# Patient Record
Sex: Male | Born: 1950 | Race: White | Hispanic: No | Marital: Married | Smoking: Never smoker
Health system: Southern US, Community
[De-identification: ages and names within clinical notes are randomized; demographics above are authoritative.]

## PROBLEM LIST (undated history)

## (undated) DIAGNOSIS — E119 Type 2 diabetes mellitus without complications: Secondary | ICD-10-CM

## (undated) HISTORY — PX: MOUTH SURGERY: SHX715

---

## 2009-03-19 ENCOUNTER — Emergency Department (HOSPITAL_COMMUNITY): Admission: EM | Admit: 2009-03-19 | Discharge: 2009-03-19 | Payer: Self-pay | Admitting: Emergency Medicine

## 2010-10-04 IMAGING — CT CT PELVIS W/ CM
1 of 3 series · 14 of 32 positions shown, 19 images · IV contrast (APPLIED)
Comparison: None

CT ABDOMEN

CLINICAL DATA: Vomiting and abdominal pain

CT ABDOMEN AND PELVIS WITH CONTRAST
TECHNIQUE: Multidetector CT imaging of the abdomen and pelvis was
performed using the standard protocol following bolus
administration of intravenous contrast.
Contrast: 100 ml of omni 300

[Series 5: abd/pelv with 5.0 b31f st · axial · 0.86mm/px · z∈[-191,+264]mm · 14 of 101 slices shown, 19 images]
[im 5/101  soft-tissue]
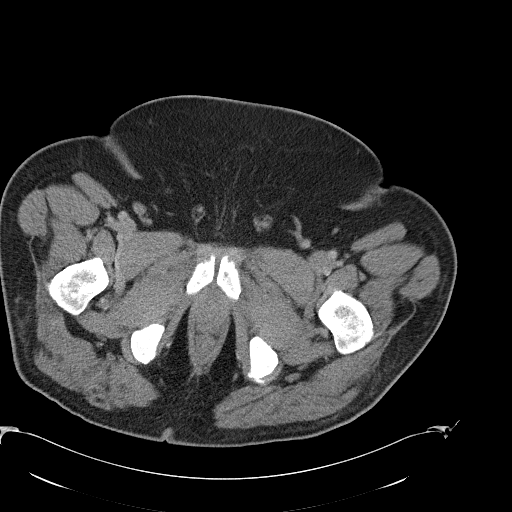
[im 5/101  bone]
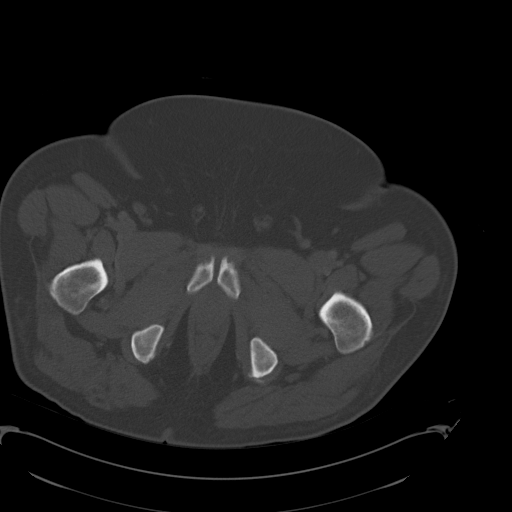
[im 15/101  soft-tissue]
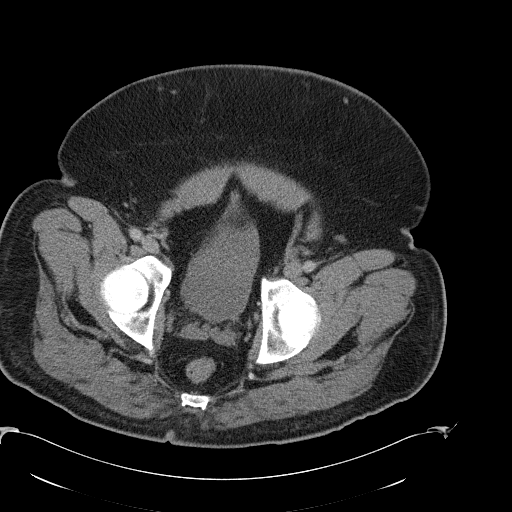
[im 20/101  soft-tissue]
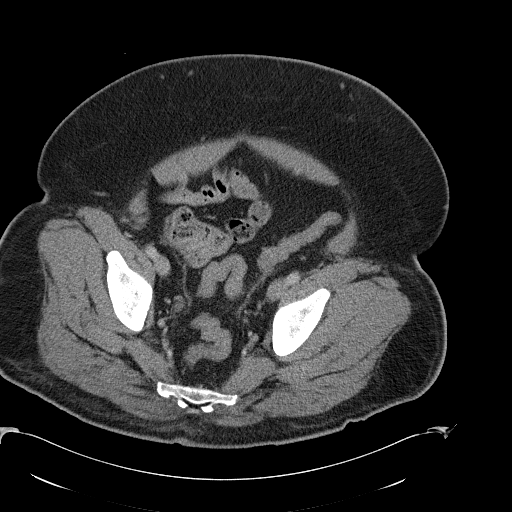
[im 29/101  soft-tissue]
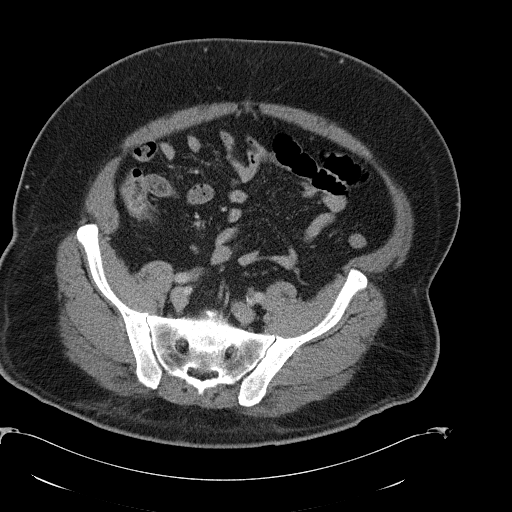
[im 34/101  soft-tissue]
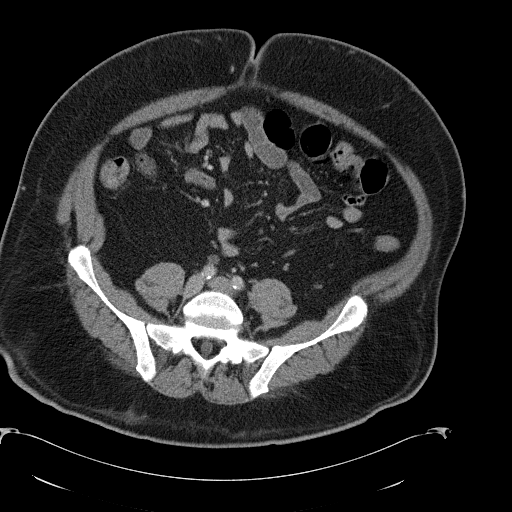
[im 43/101  soft-tissue]
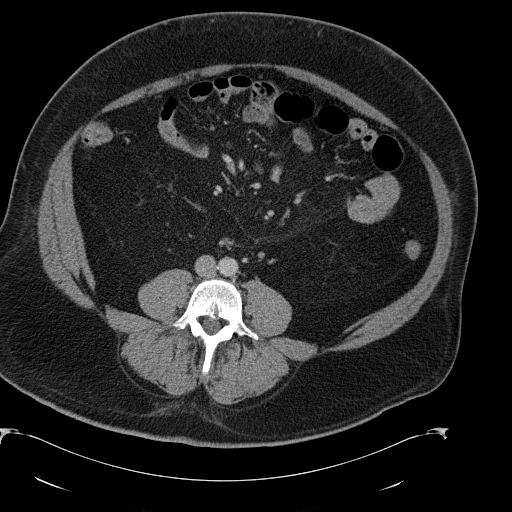
[im 53/101  soft-tissue]
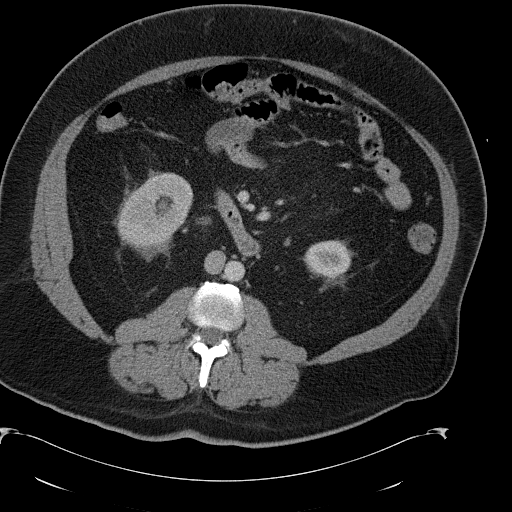
[im 58/101  soft-tissue]
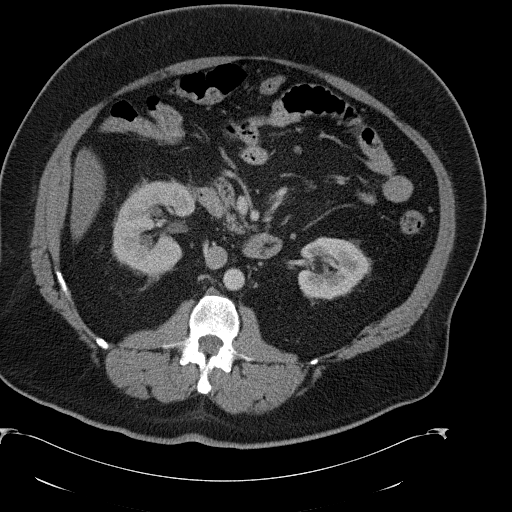
[im 67/101  soft-tissue]
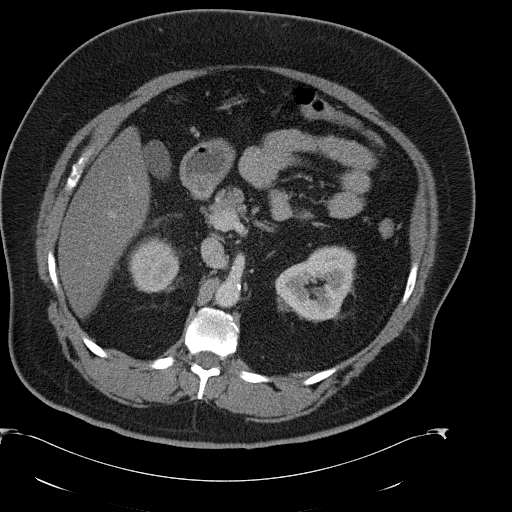
[im 67/101  bone]
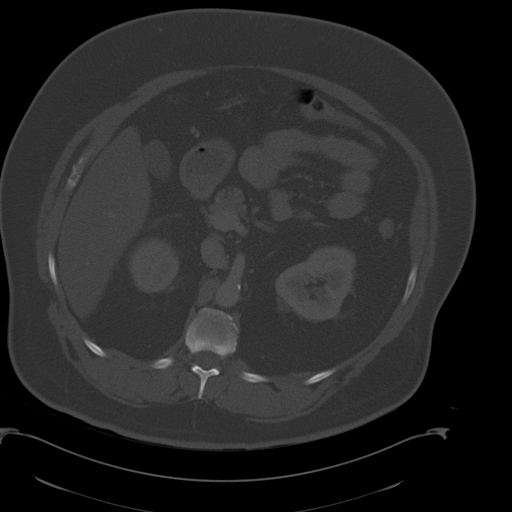
[im 72/101  soft-tissue]
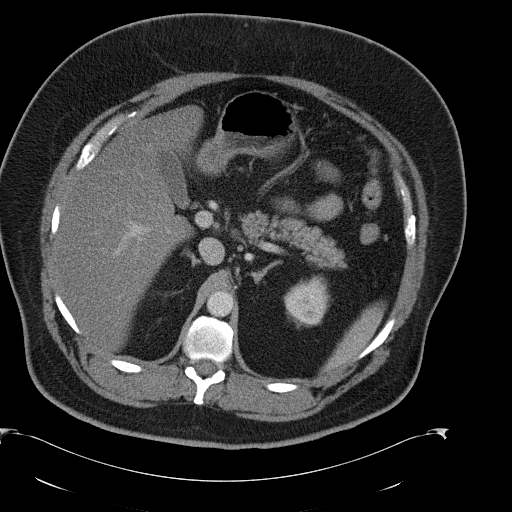
[im 81/101  soft-tissue]
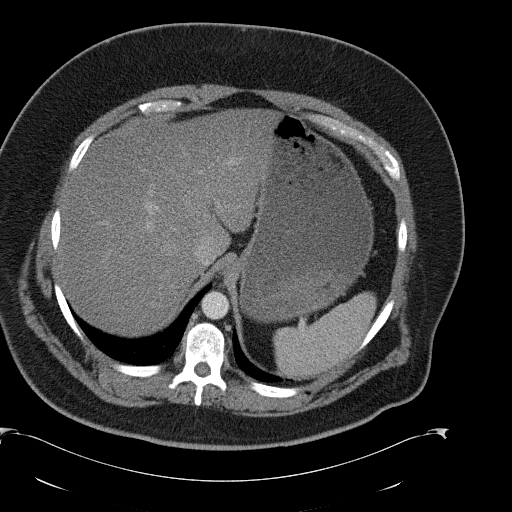
[im 81/101  lung]
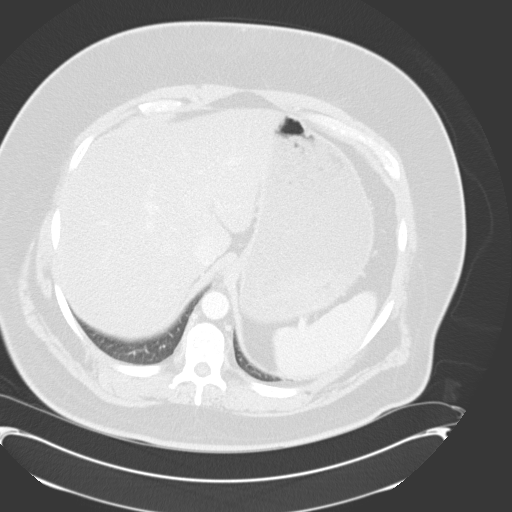
[im 86/101  soft-tissue]
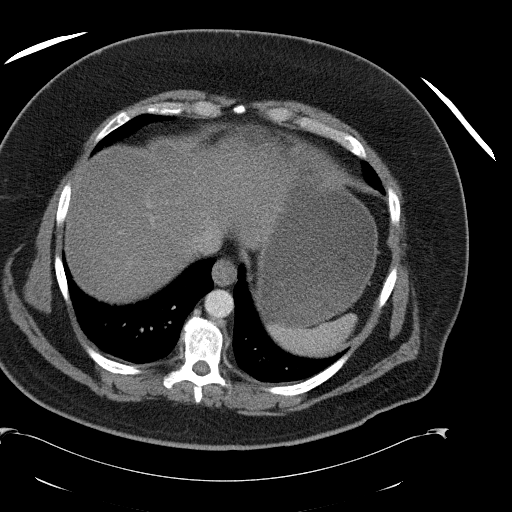
[im 86/101  lung]
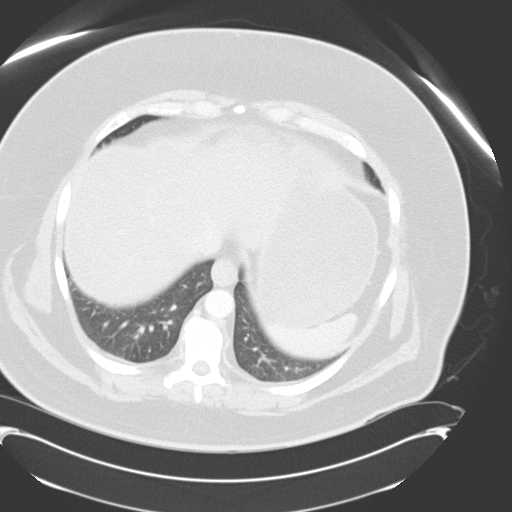
[im 91/101  lung]
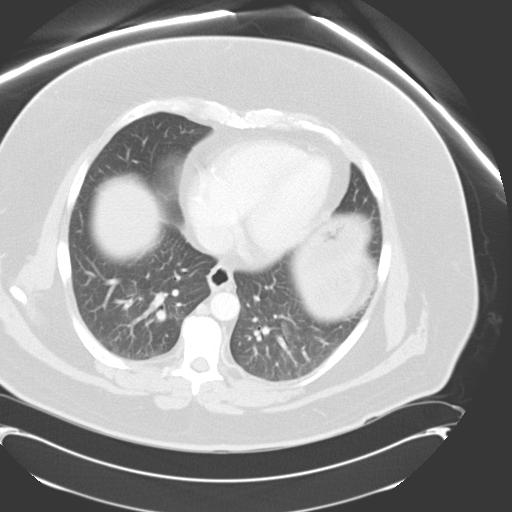
[im 96/101  soft-tissue]
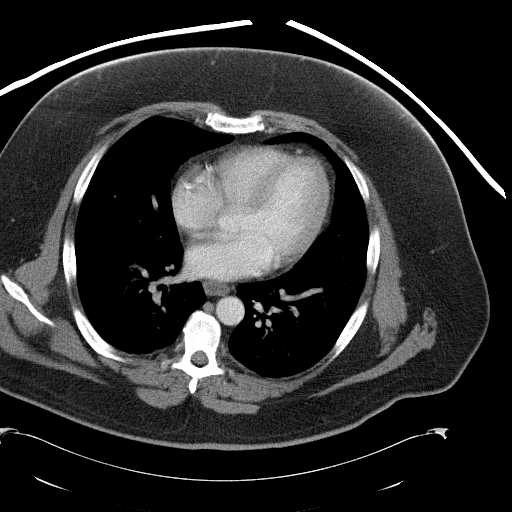
[im 96/101  lung]
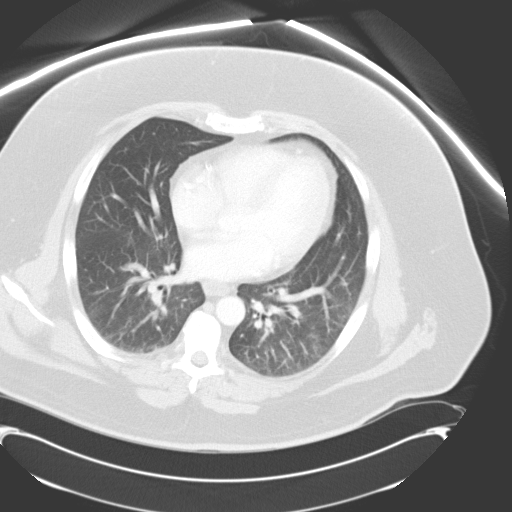

[14 of 32 positions shown; findings below may reference images not displayed]

FINDINGS: Visualized lung bases are clear.

There is fatty infiltration of the liver.

No focal liver abnormalities are noted.

Spleen is normal.

The pancreas is normal.

The adrenal glands are normal.

The left kidney is normal.

There is asymmetric right-sided nephromegaly and perinephric fat
stranding.  There is a small stone within the upper pole of the
right kidney.  Mild right-sided hydronephrosis and hydroureter is
noted.  Within the distal right ureter there is a stone which
measures 3.1 mm.

Gallbladder is unremarkable by CT.

No biliary ductal dilation.

Stomach and visualized large and small bowel are unremarkable.

Abdominal aorta normal is in caliber.

No significant lymphadenopathy.

No free fluid or abnormal fluid collections.
IMPRESSION: 1.  Distal right ureteral calculus measures approximately 3 mm.
There is resultant right-sided hydronephrosis.
2.  Fatty infiltration of the liver parenchyma.

CT PELVIS
FINDINGS: Visualized colon and small bowel are unremarkable.

No free fluid or abnormal fluid collections.

No significant lymphadenopathy.

Urinary bladder is normal.

Within the distal right ureter there is a small stone measuring
mm, image 84.
IMPRESSION: 1.  Distal right ureteral calculus measures 3.1 mm

## 2010-12-23 LAB — URINALYSIS, ROUTINE W REFLEX MICROSCOPIC
Bilirubin Urine: NEGATIVE
Glucose, UA: 500 mg/dL — AB
Ketones, ur: 15 mg/dL — AB
Leukocytes, UA: NEGATIVE
Nitrite: NEGATIVE
Protein, ur: NEGATIVE mg/dL
Specific Gravity, Urine: >1.046 — ABNORMAL HIGH (ref 1.005–1.030)
Urobilinogen, UA: 0.2 mg/dL (ref 0.0–1.0)
pH: 5.5 (ref 5.0–8.0)

## 2010-12-23 LAB — DIFFERENTIAL
Basophils Absolute: 0.1 10*3/uL (ref 0.0–0.1)
Basophils Relative: 1 % (ref 0–1)
Eosinophils Absolute: 0.2 10*3/uL (ref 0.0–0.7)
Eosinophils Relative: 2 % (ref 0–5)
Lymphocytes Relative: 15 % (ref 12–46)
Lymphs Abs: 1.7 10*3/uL (ref 0.7–4.0)
Monocytes Absolute: 0.8 10*3/uL (ref 0.1–1.0)
Monocytes Relative: 7 % (ref 3–12)
Neutro Abs: 8.4 10*3/uL — ABNORMAL HIGH (ref 1.7–7.7)
Neutrophils Relative %: 75 % (ref 43–77)

## 2010-12-23 LAB — COMPREHENSIVE METABOLIC PANEL WITH GFR
ALT: 28 U/L (ref 0–53)
AST: 23 U/L (ref 0–37)
Albumin: 3.7 g/dL (ref 3.5–5.2)
Alkaline Phosphatase: 56 U/L (ref 39–117)
BUN: 18 mg/dL (ref 6–23)
CO2: 24 meq/L (ref 19–32)
Calcium: 9.1 mg/dL (ref 8.4–10.5)
Chloride: 103 meq/L (ref 96–112)
Creatinine, Ser: 1.2 mg/dL (ref 0.4–1.5)
GFR calc non Af Amer: >60 mL/min
Glucose, Bld: 232 mg/dL — ABNORMAL HIGH (ref 70–99)
Potassium: 3.7 meq/L (ref 3.5–5.1)
Sodium: 135 meq/L (ref 135–145)
Total Bilirubin: 0.9 mg/dL (ref 0.3–1.2)
Total Protein: 6.3 g/dL (ref 6.0–8.3)

## 2010-12-23 LAB — URINE MICROSCOPIC-ADD ON

## 2010-12-23 LAB — CBC
HCT: 40.9 % (ref 39.0–52.0)
Hemoglobin: 13.9 g/dL (ref 13.0–17.0)
RBC: 4.65 MIL/uL (ref 4.22–5.81)
RDW: 15.8 % — ABNORMAL HIGH (ref 11.5–15.5)

## 2010-12-23 LAB — LIPASE, BLOOD: Lipase: 24 U/L (ref 11–59)

## 2018-07-09 ENCOUNTER — Encounter (HOSPITAL_COMMUNITY): Payer: Self-pay

## 2018-07-09 ENCOUNTER — Emergency Department (HOSPITAL_COMMUNITY)
Admission: EM | Admit: 2018-07-09 | Discharge: 2018-07-09 | Disposition: A | Discharge status: Home / Self Care | Payer: Medicare Other | Attending: Emergency Medicine | Admitting: Emergency Medicine

## 2018-07-09 ENCOUNTER — Other Ambulatory Visit: Payer: Self-pay

## 2018-07-09 DIAGNOSIS — Z794 Long term (current) use of insulin: Secondary | ICD-10-CM | POA: Insufficient documentation

## 2018-07-09 DIAGNOSIS — Z7982 Long term (current) use of aspirin: Secondary | ICD-10-CM | POA: Diagnosis not present

## 2018-07-09 DIAGNOSIS — Z79899 Other long term (current) drug therapy: Secondary | ICD-10-CM | POA: Insufficient documentation

## 2018-07-09 DIAGNOSIS — E119 Type 2 diabetes mellitus without complications: Secondary | ICD-10-CM | POA: Insufficient documentation

## 2018-07-09 DIAGNOSIS — R42 Dizziness and giddiness: Secondary | ICD-10-CM

## 2018-07-09 HISTORY — DX: Type 2 diabetes mellitus without complications: E11.9

## 2018-07-09 LAB — CBC WITH DIFFERENTIAL/PLATELET
ABS IMMATURE GRANULOCYTES: 0.07 10*3/uL (ref 0.00–0.07)
BASOS PCT: 1 %
Basophils Absolute: 0.1 10*3/uL (ref 0.0–0.1)
EOS ABS: 0.1 10*3/uL (ref 0.0–0.5)
Eosinophils Relative: 1 %
HCT: 42.6 % (ref 39.0–52.0)
Hemoglobin: 13.4 g/dL (ref 13.0–17.0)
Immature Granulocytes: 1 %
Lymphocytes Relative: 11 %
Lymphs Abs: 1.4 10*3/uL (ref 0.7–4.0)
MCH: 28.9 pg (ref 26.0–34.0)
MCHC: 31.5 g/dL (ref 30.0–36.0)
MCV: 91.8 fL (ref 80.0–100.0)
MONO ABS: 0.8 10*3/uL (ref 0.1–1.0)
Monocytes Relative: 6 %
NEUTROS ABS: 10.3 10*3/uL — AB (ref 1.7–7.7)
Neutrophils Relative %: 80 %
PLATELETS: 252 10*3/uL (ref 150–400)
RBC: 4.64 MIL/uL (ref 4.22–5.81)
RDW: 13.9 % (ref 11.5–15.5)
WBC: 12.7 10*3/uL — AB (ref 4.0–10.5)
nRBC: 0 % (ref 0.0–0.2)

## 2018-07-09 LAB — COMPREHENSIVE METABOLIC PANEL
ALT: 46 U/L — ABNORMAL HIGH (ref 0–44)
AST: 43 U/L — ABNORMAL HIGH (ref 15–41)
Albumin: 3.6 g/dL (ref 3.5–5.0)
Alkaline Phosphatase: 64 U/L (ref 38–126)
Anion gap: 11 (ref 5–15)
BILIRUBIN TOTAL: 1 mg/dL (ref 0.3–1.2)
BUN: 19 mg/dL (ref 8–23)
CO2: 24 mmol/L (ref 22–32)
Calcium: 9.4 mg/dL (ref 8.9–10.3)
Chloride: 104 mmol/L (ref 98–111)
Creatinine, Ser: 1.11 mg/dL (ref 0.61–1.24)
GFR calc Af Amer: >60 mL/min (ref >60)
Glucose, Bld: 79 mg/dL (ref 70–99)
POTASSIUM: 4 mmol/L (ref 3.5–5.1)
Sodium: 139 mmol/L (ref 135–145)
TOTAL PROTEIN: 6.2 g/dL — AB (ref 6.5–8.1)

## 2018-07-09 LAB — URINALYSIS, ROUTINE W REFLEX MICROSCOPIC
Bacteria, UA: NONE SEEN
Bilirubin Urine: NEGATIVE
GLUCOSE, UA: 50 mg/dL — AB
Hgb urine dipstick: NEGATIVE
Ketones, ur: NEGATIVE mg/dL
Leukocytes, UA: NEGATIVE
Nitrite: NEGATIVE
PH: 6 (ref 5.0–8.0)
Protein, ur: 100 mg/dL — AB
SPECIFIC GRAVITY, URINE: 1.021 (ref 1.005–1.030)

## 2018-07-09 MED ORDER — ONDANSETRON HCL 4 MG/2ML IJ SOLN
4.0000 mg | Freq: Once | INTRAMUSCULAR | Status: AC
  Administered 2018-07-09: 4 mg via INTRAVENOUS
  Filled 2018-07-09: qty 2

## 2018-07-09 MED ORDER — MECLIZINE HCL 25 MG PO TABS
25.0000 mg | ORAL_TABLET | Freq: Once | ORAL | Status: AC
  Administered 2018-07-09: 25 mg via ORAL
  Filled 2018-07-09: qty 1

## 2018-07-09 MED ORDER — MECLIZINE HCL 25 MG PO TABS: 25.0000 mg | ORAL_TABLET | Freq: Three times a day (TID) | ORAL | 0 refills | Status: DC | PRN

## 2018-07-09 MED ORDER — MECLIZINE HCL 25 MG PO TABS: 25.0000 mg | ORAL_TABLET | Freq: Three times a day (TID) | ORAL | 0 refills | Status: AC | PRN

## 2018-07-09 NOTE — ED Triage Notes (Signed)
Per [facility] pt had a sudden onset of dizziness around 1500. EMS reports nystagmus when looking to each side and one episode of vomiting. EMS gave 4mg  of zofran IV.

## 2018-07-09 NOTE — ED Notes (Signed)
Patient verbalizes understanding of discharge instructions. Opportunity for questioning and answers were provided. Armband removed by staff, pt discharged from ED ambulatory w/ family  

## 2018-07-10 NOTE — ED Provider Notes (Signed)
[facility] EMERGENCY DEPARTMENT Provider Note   CSN: [id number] Arrival date & time: 07/09/18  1638     History   Chief Complaint Chief Complaint  Patient presents with  . Dizziness    HPI Rodney Blake is a 67 y.o. male.  67 year old male with past medical history including IDDM, hypertension who presents with dizziness.  Earlier today around 3 PM, he had a sudden onset of dizziness which he states initially starts as lightheadedness but then becomes room spinning sensation.  Today he had an episode of vomiting.  He received 4 mg IV Zofran by EMS.  He reports that he has had 2 other episodes of dizziness this week, usually provoked by moving his head quickly or sitting up.  He has had nausea with these previous episodes.  He denies any associated chest pain, shortness of breath, palpitations, extremity weakness/numbness, vision changes, headache, or balance problems.  He had this problem along time ago but not anytime recently.  He has never taken any medications for it.  No new medications.  His blood sugar has been doing better recently. No recent illness.  The history is provided by the patient.  Dizziness    Past Medical History:  Diagnosis Date  . Diabetes mellitus without complication (HCC)     There are no active problems to display for this patient.   Past Surgical History:  Procedure Laterality Date  . MOUTH SURGERY          Home Medications    Prior to Admission medications   Medication Sig Start Date End Date Taking? Authorizing Provider  amLODipine (NORVASC) 5 MG tablet Take 2.5 tablets by mouth daily. 07/03/18  Yes [provider]  aspirin 81 MG chewable tablet Chew 81 mg by mouth daily.   Yes [provider]  atorvastatin (LIPITOR) 40 MG tablet Take 40 mg by mouth daily. 06/01/18  Yes [provider]  Dulaglutide (TRULICITY) 1.5 MG/0.5ML SOPN Inject 1.5 mg into the skin every Tuesday.  07/02/18  Yes  [provider]  insulin aspart (NOVOLOG) 100 UNIT/ML FlexPen Inject 40 Units into the skin 3 (three) times daily before meals. 12/27/13  Yes [provider]  Insulin Degludec (TRESIBA FLEXTOUCH) 200 UNIT/ML SOPN Inject 100 Units into the skin at bedtime. 06/05/18  Yes [provider]  metFORMIN (GLUCOPHAGE-XR) 500 MG 24 hr tablet Take 500 mg by mouth 2 (two) times daily. 12/11/14  Yes [provider]  Multiple Vitamin (MULTIVITAMIN) capsule Take 1 capsule by mouth daily.   Yes [provider]  valsartan-hydrochlorothiazide (DIOVAN-HCT) 160-12.5 MG tablet Take 0.5 tablets by mouth 2 (two) times daily. 03/20/17  Yes [provider]  meclizine (ANTIVERT) 25 MG tablet Take 1 tablet (25 mg total) by mouth 3 (three) times daily as needed for up to 10 doses for dizziness. 07/09/18   Little, Ambrose [place], MD    Family History No family history on file.  Social History Social History   Tobacco Use  . Smoking status: Never Smoker  . Smokeless tobacco: Never Used  Substance Use Topics  . Alcohol use: Yes  . Drug use: Not on file     Allergies   Erythromycin and Penicillins   Review of Systems Review of Systems  Neurological: Positive for dizziness.   All other systems reviewed and are negative except that which was mentioned in HPI   Physical Exam Updated Vital Signs BP 137/71 (BP Location: Right Arm)   Pulse 67   Temp  98.1 F (36.7 C)   Resp 18   SpO2 96%   Physical Exam  Constitutional: He is oriented to person, place, and time. He appears well-developed and well-nourished. No distress.  Awake, alert  HENT:  Head: Normocephalic and atraumatic.  Eyes: Pupils are equal, round, and reactive to light. Conjunctivae and EOM are normal.  Neck: Neck supple.  Cardiovascular: Normal rate, regular rhythm and normal heart sounds.  No murmur heard. Pulmonary/Chest: Effort normal and breath sounds normal. No respiratory distress.    Abdominal: Soft. Bowel sounds are normal. He exhibits no distension. There is no tenderness.  Musculoskeletal: He exhibits edema (mild BLE).  Neurological: He is alert and oriented to person, place, and time. He has normal reflexes. No cranial nerve deficit. He exhibits normal muscle tone.  Fluent speech, normal finger-to-nose testing, negative pronator drift, no clonus 5/5 strength and normal sensation x all 4 extremities  Skin: Skin is warm and dry.  Psychiatric: He has a normal mood and affect. Judgment and thought content normal.  Nursing note and vitals reviewed.    ED Treatments / Results  Labs (all labs ordered are listed, but only abnormal results are displayed) Labs Reviewed  COMPREHENSIVE METABOLIC PANEL - Abnormal; Notable for the following components:      Result Value   Total Protein 6.2 (*)    AST 43 (*)    ALT 46 (*)    All other components within normal limits  CBC WITH DIFFERENTIAL/PLATELET - Abnormal; Notable for the following components:   WBC 12.7 (*)    Neutro Abs 10.3 (*)    All other components within normal limits  URINALYSIS, ROUTINE W REFLEX MICROSCOPIC - Abnormal; Notable for the following components:   Glucose, UA 50 (*)    Protein, ur 100 (*)    All other components within normal limits    EKG EKG Interpretation  Date/Time:  Thursday July 09 2018 16:58:35 EDT Ventricular Rate:  69 PR Interval:    QRS Duration: 105 QT Interval:  421 QTC Calculation: 451 R Axis:   -34 Text Interpretation:  Sinus rhythm Low voltage, precordial leads Abnormal R-wave progression, early transition Left ventricular hypertrophy No previous ECGs available Confirmed by Frederick Peers [phone]) on 07/09/2018 5:15:32 PM   Radiology No results found.  Procedures Procedures (including critical care time)  Medications Ordered in ED Medications  ondansetron (ZOFRAN) injection 4 mg (4 mg Intravenous Given 07/09/18 1844)  meclizine (ANTIVERT) tablet 25 mg (25 mg Oral  Given 07/09/18 1844)     Initial Impression / Assessment and Plan / ED Course  I have reviewed the triage vital signs and the nursing notes.  Pertinent labs  that were available during my care of the patient were reviewed by me and considered in my medical decision making (see chart for details).     Well-appearing on exam, reassuring vital signs, EKG reassuring.  Normal neurologic exam.  He was already improving by the time I evaluated him.  Gave Zofran and meclizine stated he felt much better on reassessment.  His lab work today is reassuring with normal blood glucose, no evidence of dehydration, normal blood counts.  Given that symptoms are episodic, seem to be provoked by head movements, and are not associated with any other neurologic symptoms, I suspect peripheral cause of vertigo.  Because he has improved with meclizine, provided with the medication to use as needed at home.  He will follow-up with PCP and understands return precautions.  Final Clinical Impressions(s) / ED  Diagnoses   Final diagnoses:  Vertigo    ED Discharge Orders         Ordered    meclizine (ANTIVERT) 25 MG tablet  3 times daily PRN,   Status:  Discontinued     07/09/18 2326    meclizine (ANTIVERT) 25 MG tablet  3 times daily PRN     07/09/18 2345           Little, Ambrose [place], MD 07/10/18 [phone]
